# Patient Record
Sex: Male | Born: 1980 | Race: White | Hispanic: No | Marital: Single | Smoking: Current every day smoker
Health system: Southern US, Community
[De-identification: ages and names within clinical notes are randomized; demographics above are authoritative.]

## PROBLEM LIST (undated history)

## (undated) DIAGNOSIS — K859 Acute pancreatitis without necrosis or infection, unspecified: Secondary | ICD-10-CM

## (undated) HISTORY — PX: FOREIGN BODY REMOVAL EAR: SHX5321

---

## 2007-04-13 ENCOUNTER — Emergency Department: Payer: Self-pay | Admitting: Emergency Medicine

## 2010-07-22 ENCOUNTER — Emergency Department: Payer: Self-pay | Admitting: Emergency Medicine

## 2011-01-31 ENCOUNTER — Emergency Department: Payer: Self-pay | Admitting: Emergency Medicine

## 2012-08-04 ENCOUNTER — Emergency Department: Payer: Self-pay | Admitting: Emergency Medicine

## 2013-08-31 IMAGING — CR RIGHT HAND - COMPLETE 3+ VIEW
1 series · 3 of 3 positions shown · non-contrast
Comparison: none

REASON FOR EXAM: pain/injury with glass door
COMMENTS:

[Series 1: x hand pa right · 0.14mm/px · 3 of 3 slices shown]
[im 1/3]
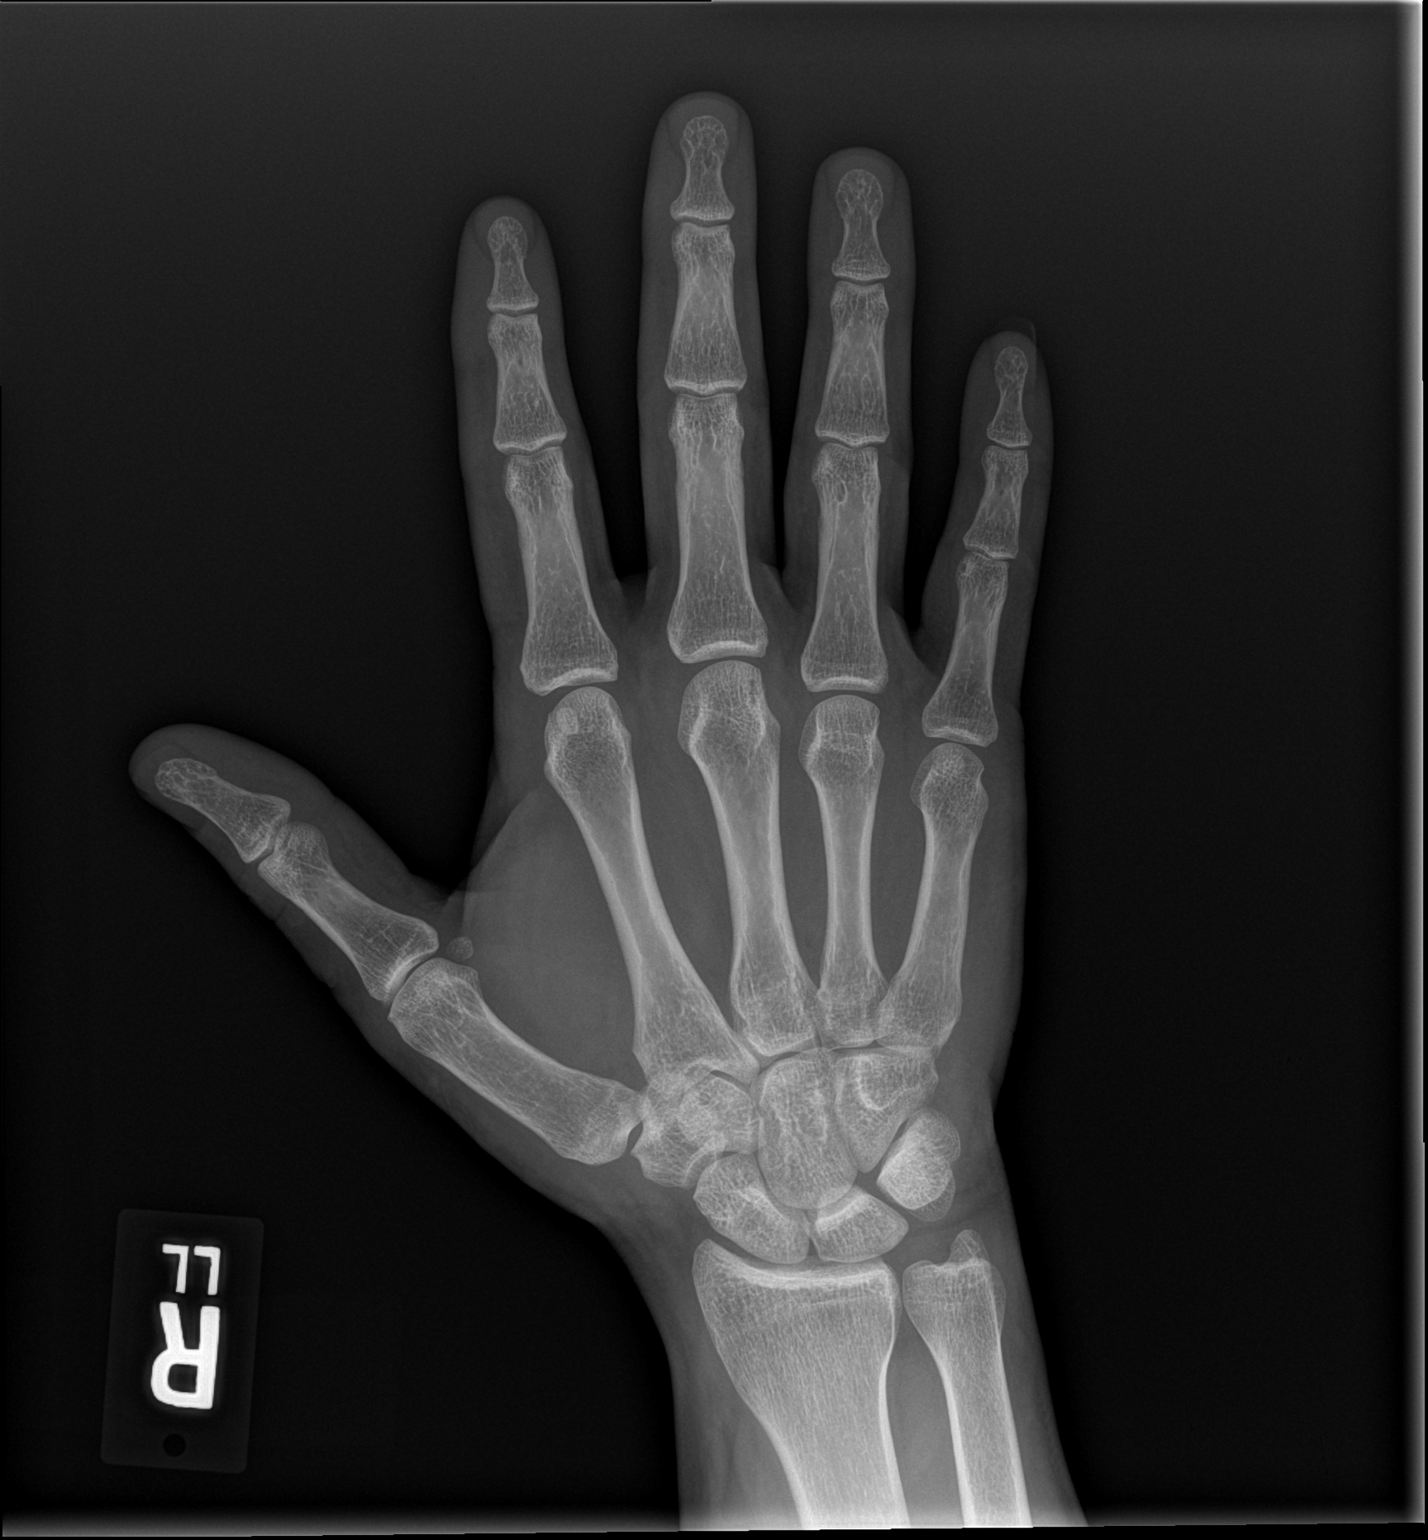
[im 2/3]
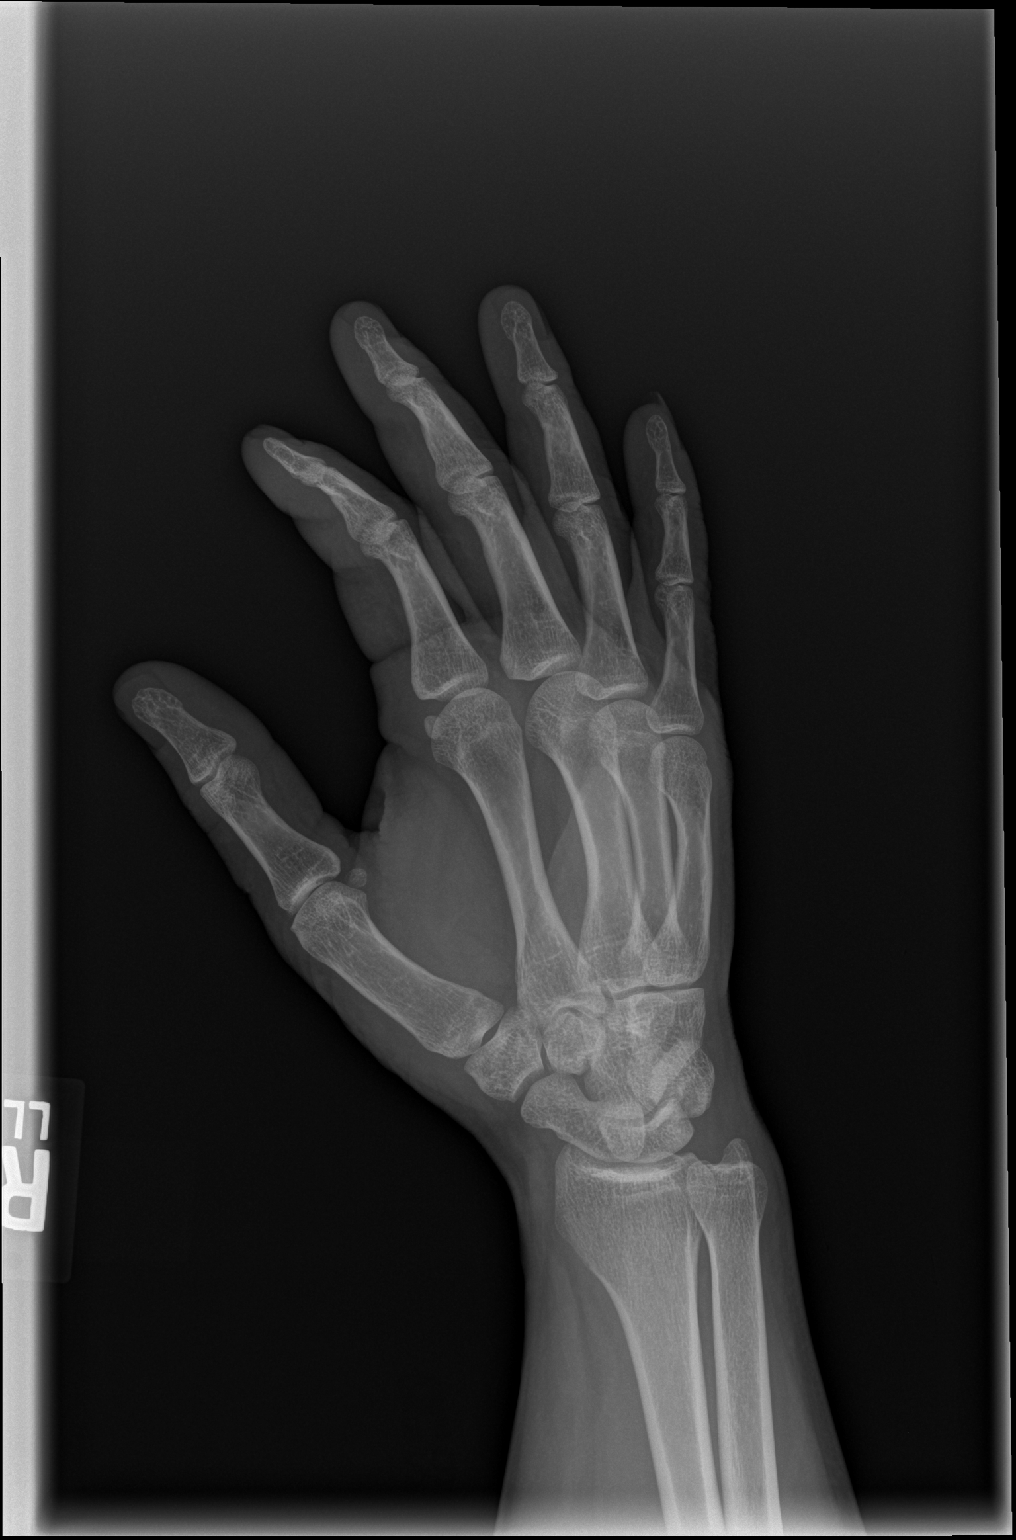
[im 3/3]
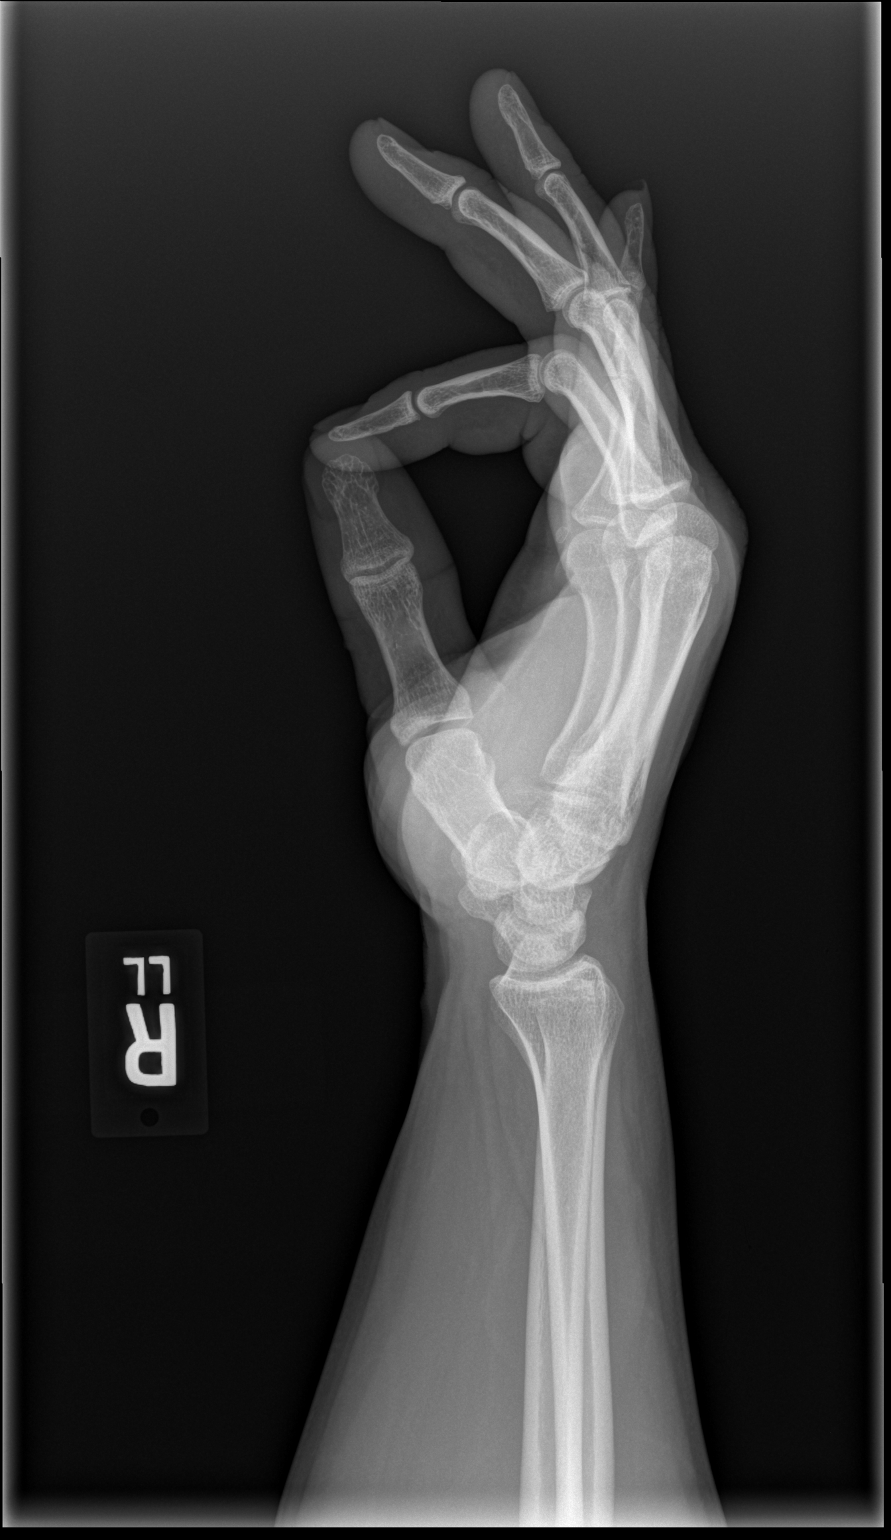

[3 of 3 positions shown; findings below may reference images not displayed]

PROCEDURE:     DXR - DXR HAND RT COMPLETE W/OBLIQUES  - August 04, 2012  [DATE]

RESULT:     Three views of the right hand are submitted. The patient site of
the patient's symptoms is not noted in the clinical history.

The bones appear adequately mineralized. There is no evidence of a fracture
nor dislocation. No radiopaque foreign bodies are demonstrated within the
soft tissues.
IMPRESSION: I do not see a retained foreign bodies within the soft
tissues of the right hand. There is no evidence of an acute fracture.

[REDACTED]

## 2020-05-04 ENCOUNTER — Encounter: Payer: Self-pay | Admitting: Emergency Medicine

## 2020-05-04 ENCOUNTER — Emergency Department: Payer: Self-pay

## 2020-05-04 ENCOUNTER — Emergency Department
Admission: EM | Admit: 2020-05-04 | Discharge: 2020-05-04 | Disposition: A | Discharge status: Home / Self Care | Payer: Self-pay | Attending: Emergency Medicine | Admitting: Emergency Medicine

## 2020-05-04 ENCOUNTER — Other Ambulatory Visit: Payer: Self-pay

## 2020-05-04 DIAGNOSIS — F17210 Nicotine dependence, cigarettes, uncomplicated: Secondary | ICD-10-CM | POA: Insufficient documentation

## 2020-05-04 DIAGNOSIS — Y9344 Activity, trampolining: Secondary | ICD-10-CM | POA: Insufficient documentation

## 2020-05-04 DIAGNOSIS — Y92838 Other recreation area as the place of occurrence of the external cause: Secondary | ICD-10-CM | POA: Insufficient documentation

## 2020-05-04 DIAGNOSIS — X501XXA Overexertion from prolonged static or awkward postures, initial encounter: Secondary | ICD-10-CM | POA: Insufficient documentation

## 2020-05-04 DIAGNOSIS — Y999 Unspecified external cause status: Secondary | ICD-10-CM | POA: Insufficient documentation

## 2020-05-04 DIAGNOSIS — S93401A Sprain of unspecified ligament of right ankle, initial encounter: Secondary | ICD-10-CM | POA: Insufficient documentation

## 2020-05-04 HISTORY — DX: Acute pancreatitis without necrosis or infection, unspecified: K85.90

## 2020-05-04 MED ORDER — IBUPROFEN 600 MG PO TABS
600.0000 mg | ORAL_TABLET | Freq: Once | ORAL | Status: AC
  Administered 2020-05-04: 600 mg via ORAL
  Filled 2020-05-04: qty 1

## 2020-05-04 MED ORDER — OXYCODONE-ACETAMINOPHEN 5-325 MG PO TABS
1.0000 | ORAL_TABLET | Freq: Once | ORAL | Status: AC
  Administered 2020-05-04: 1 via ORAL
  Filled 2020-05-04: qty 1

## 2020-05-04 MED ORDER — OXYCODONE-ACETAMINOPHEN 7.5-325 MG PO TABS: 1.0000 | ORAL_TABLET | Freq: Four times a day (QID) | ORAL | 0 refills | Status: AC | PRN

## 2020-05-04 MED ORDER — IBUPROFEN 600 MG PO TABS: 600.0000 mg | ORAL_TABLET | Freq: Three times a day (TID) | ORAL | 0 refills | Status: AC | PRN

## 2020-05-04 NOTE — ED Provider Notes (Signed)
[facility] Emergency Department Provider Note   ____________________________________________   First MD Initiated Contact with Patient 05/04/20 1351     (approximate)  I have reviewed the triage vital signs and the nursing notes.   HISTORY  Chief Complaint Ankle Pain    HPI Victor Harrison is a 39 y.o. male patient complain of right ankle pain.  Patient state she had a twisting incident last Sunday and after resting ankle felt better.  Patient state yesterday she was jumping on trampoline and the pain returned with increased swelling.  Patient denies loss of sensation or function.  Patient rates the pain as a 3/10.  Patient described the pain as "achy".  No palliative measure for complaint.         Past Medical History:  Diagnosis Date  . Pancreatitis     There are no problems to display for this patient.   Past Surgical History:  Procedure Laterality Date  . FOREIGN BODY REMOVAL EAR      Prior to Admission medications   Medication Sig Start Date End Date Taking? Authorizing Provider  ibuprofen (ADVIL) 600 MG tablet Take 1 tablet (600 mg total) by mouth every 8 (eight) hours as needed. 05/04/20   Joni Reining, PA-C  oxyCODONE-acetaminophen (PERCOCET) 7.5-325 MG tablet Take 1 tablet by mouth every 6 (six) hours as needed. 05/04/20   Joni Reining, PA-C    Allergies Patient has no known allergies.  No family history on file.  Social History Social History   Tobacco Use  . Smoking status: Current Every Day Smoker    Packs/day: 1.00    Types: Cigarettes  . Smokeless tobacco: Never Used  Substance Use Topics  . Alcohol use: Yes  . Drug use: Yes    Types: Marijuana    Comment: couple x per week    Review of Systems Constitutional: No fever/chills Eyes: No visual changes. ENT: No sore throat. Cardiovascular: Denies chest pain. Respiratory: Denies shortness of breath. Gastrointestinal: No abdominal pain.  No nausea, no  vomiting.  No diarrhea.  No constipation. Genitourinary: Negative for dysuria. Musculoskeletal: Right ankle pain and edema. Skin: Negative for rash. Neurological: Negative for headaches, focal weakness or numbness.   ____________________________________________   PHYSICAL EXAM:  VITAL SIGNS: ED Triage Vitals  Enc Vitals Group     BP 05/04/20 1333 (!) 163/98     Pulse Rate 05/04/20 1333 (!) 113     Resp 05/04/20 1333 16     Temp 05/04/20 1333 99.4 F (37.4 C)     Temp Source 05/04/20 1333 Oral     SpO2 05/04/20 1333 98 %     Weight 05/04/20 1333 167 lb (75.8 kg)     Height 05/04/20 1333 5\' 9"  (1.753 m)     Head Circumference --      Peak Flow --      Pain Score 05/04/20 1339 3     Pain Loc --      Pain Edu? --      Excl. in GC? --    Constitutional: Alert and oriented. Well appearing and in no acute distress. Cardiovascular: Tachycardic, regular rhythm. Grossly normal heart sounds.  Good peripheral circulation.  Elevated blood pressure. Respiratory: Normal respiratory effort.  No retractions. Lungs CTAB. Musculoskeletal: No obvious deformity to the right ankle.  Mild edema.   Neurologic:  Normal speech and language. No gross focal neurologic deficits are appreciated. No gait instability. Skin:  Skin is warm, dry and  intact. No rash noted.  No abrasion or ecchymosis. Psychiatric: Mood and affect are normal. Speech and behavior are normal.  ____________________________________________   LABS (all labs ordered are listed, but only abnormal results are displayed)  Labs Reviewed - No data to display ____________________________________________  EKG   ____________________________________________  RADIOLOGY  ED MD interpretation:    Official radiology report(s): DG Ankle Complete Right  Result Date: 05/04/2020 CLINICAL DATA:  Trampoline injury yesterday with ankle pain, initial encounter EXAM: RIGHT ANKLE - COMPLETE 3+ VIEW COMPARISON:  None. FINDINGS: Considerable  soft tissue swelling is noted laterally. No acute fracture or dislocation is noted. IMPRESSION: Lateral soft tissue swelling without acute bony abnormality. Electronically Signed   By: Inez Catalina M.D.   On: 05/04/2020 14:27    ____________________________________________   PROCEDURES  Procedure(s) performed (including Critical Care):  Procedures   ____________________________________________   INITIAL IMPRESSION / ASSESSMENT AND PLAN / ED COURSE  As part of my medical decision making, I reviewed the following data within the [facility]     Patient presents with right ankle pain secondary to to twisting incident.  Discussed x-ray finding consistent with ankle sprain.  Patient placed in and ankle stirrup splint and given crutches to assist with ambulation.  Take medication as directed.  Patient advised establish care with [facility].    Victor Harrison was evaluated in Emergency Department on 05/04/2020 for the symptoms described in the history of present illness. He was evaluated in the context of the global COVID-19 pandemic, which necessitated consideration that the patient might be at risk for infection with the SARS-CoV-2 virus that causes COVID-19. Institutional protocols and algorithms that pertain to the evaluation of patients at risk for COVID-19 are in a state of rapid change based on information released by regulatory bodies including the CDC and federal and state organizations. These policies and algorithms were followed during the patient's care in the ED.       ____________________________________________   FINAL CLINICAL IMPRESSION(S) / ED DIAGNOSES  Final diagnoses:  Sprain of right ankle, unspecified ligament, initial encounter     ED Discharge Orders         Ordered    oxyCODONE-acetaminophen (PERCOCET) 7.5-325 MG tablet  Every 6 hours PRN     05/04/20 1442    ibuprofen (ADVIL) 600 MG tablet  Every 8 hours PRN     05/04/20 1442            Note:  This document was prepared using Dragon voice recognition software and may include unintentional dictation errors.    Sable Feil, PA-C 05/04/20 1450    Vanessa Animas, MD 05/05/20 [phone]

## 2020-05-04 NOTE — Discharge Instructions (Addendum)
Follow discharge care instruction take medication as directed.  Ambulate with a splint and crutches for 3 to 5 days as needed.

## 2020-05-04 NOTE — ED Triage Notes (Signed)
Pt presents to ED via POV with c/o R ankle pain, states twisted it last Sunday, then it felt better, then reports yesterday was jumping on a trampoline and hurt it again. Pt presents with obvious swelling upon arrival to ED.

## 2020-07-09 ENCOUNTER — Telehealth: Payer: Self-pay | Admitting: General Practice

## 2020-07-09 NOTE — Telephone Encounter (Signed)
Individual has been contacted 3+ times regarding ED referral. No further attempts to contact individual will be made. 

## 2022-09-20 DEATH — deceased
